# Patient Record
Sex: Female | Born: 1960 | Race: White | Hispanic: No | Marital: Married | Smoking: Never smoker
Health system: Southern US, Community
[De-identification: ages and names within clinical notes are randomized; demographics above are authoritative.]

## PROBLEM LIST (undated history)

## (undated) DIAGNOSIS — F419 Anxiety disorder, unspecified: Secondary | ICD-10-CM

## (undated) DIAGNOSIS — I10 Essential (primary) hypertension: Secondary | ICD-10-CM

## (undated) HISTORY — PX: ABDOMINAL HYSTERECTOMY: SHX81

## (undated) HISTORY — DX: Anxiety disorder, unspecified: F41.9

## (undated) HISTORY — DX: Essential (primary) hypertension: I10

---

## 2014-09-14 ENCOUNTER — Ambulatory Visit (INDEPENDENT_AMBULATORY_CARE_PROVIDER_SITE_OTHER): Payer: BLUE CROSS/BLUE SHIELD

## 2014-09-14 ENCOUNTER — Ambulatory Visit (INDEPENDENT_AMBULATORY_CARE_PROVIDER_SITE_OTHER): Payer: BLUE CROSS/BLUE SHIELD | Admitting: Physician Assistant

## 2014-09-14 VITALS — BP 132/80 | HR 73 | Temp 98.3°F | Resp 18 | Ht 66.0 in | Wt 210.0 lb

## 2014-09-14 DIAGNOSIS — R0989 Other specified symptoms and signs involving the circulatory and respiratory systems: Secondary | ICD-10-CM

## 2014-09-14 DIAGNOSIS — R059 Cough, unspecified: Secondary | ICD-10-CM

## 2014-09-14 DIAGNOSIS — R05 Cough: Secondary | ICD-10-CM

## 2014-09-14 MED ORDER — AZITHROMYCIN 250 MG PO TABS: 500.0000 mg | ORAL_TABLET | Freq: Every day | ORAL | Status: AC

## 2014-09-14 MED ORDER — HYDROCODONE-HOMATROPINE 5-1.5 MG/5ML PO SYRP: 5.0000 mL | ORAL_SOLUTION | Freq: Three times a day (TID) | ORAL | Status: AC | PRN

## 2014-09-14 NOTE — Patient Instructions (Signed)
Your chest xray showed a likely pneumonia. I will call you with the official results when the radiologist does the over read. Please take the azithromycin, 500 mg per day for 3 days. Please follow up with your pcp when you return home. Please come back here or go to an ED if you start having worse cough, fever, chills.

## 2014-09-14 NOTE — Progress Notes (Signed)
Subjective:    Patient ID: Janet Guzman, female    DOB: 11-Mar-1961, 54 y.o.   MRN: [id number]  Chief Complaint  Patient presents with  . Cough    non productive, with tightness in chest , started monday   . Sore Throat  . Chills  . Night Sweats   Prior to Admission medications   Medication Sig Start Date End Date Taking? Authorizing Provider  bisoprolol (ZEBETA) 5 MG tablet Take 5 mg by mouth daily.   Yes Historical Provider, MD  levothyroxine (SYNTHROID, LEVOTHROID) 75 MCG tablet Take 75 mcg by mouth daily before breakfast.   Yes Historical Provider, MD  sertraline (ZOLOFT) 100 MG tablet Take 100 mg by mouth daily.   Yes Historical Provider, MD  simvastatin (ZOCOR) 80 MG tablet Take 80 mg by mouth daily.   Yes Historical Provider, MD   Medications, allergies, past medical history, surgical history, family history, social history and problem list reviewed and updated.  HPI  [age] yof visiting the area from [place] presents with cough, chills, st.   Sx started yest morning with non prod cough, head congestion. Cough has worsened over past 24 hrs. 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 Chills yest, today. Felt warm but didn't take temp. Night sweats last night. Taking delsym without much relief. Has had mild chest tightness with coughing spells. No rest cp or exertional cp. No relief with delsym.   Got flu vaccine this year.   Denies abd pain, n/v, diarrhea, otalgia.   Review of Systems See HPI.     Objective:   Physical Exam  Constitutional: She appears well-developed and well-nourished.  Non-toxic appearance. She does not have a sickly appearance. She appears ill. No distress.  BP 132/80 mmHg  Pulse 73  Temp(Src) 98.3 F (36.8 C) (Oral)  Resp 18  Ht 5\' 6"  (1.676 m)  Wt 210 lb (95.255 kg)  BMI 33.91 kg/m2  SpO2 97%   HENT:  Right Ear: Tympanic membrane normal.  Left Ear: Tympanic membrane normal.  Nose: Nose normal. Right sinus exhibits no  maxillary sinus tenderness and no frontal sinus tenderness. Left sinus exhibits no maxillary sinus tenderness and no frontal sinus tenderness.  Mouth/Throat: Uvula is midline, oropharynx is clear and moist and mucous membranes are normal. No oropharyngeal exudate, posterior oropharyngeal edema or posterior oropharyngeal erythema.  Pulmonary/Chest: Effort normal. She has no decreased breath sounds. She has wheezes in the right lower field. She has rhonchi in the right upper field, the right lower field and the left upper field. She has no rales.  Lymphadenopathy:       Head (right side): No submental, no submandibular and no tonsillar adenopathy present.       Head (left side): No submental, no submandibular and no tonsillar adenopathy present.    She has no cervical adenopathy.   [facility] reading (PRIMARY) by  Dr. Clelia CroftShaw. Findings: Bibasilar infiltrates.      Assessment & Plan:   [age] yof [place] the [place] from [place] presents with cough, chills, st.   Cough - Plan: DG Chest 2 View, azithromycin (ZITHROMAX) 250 MG tablet, HYDROcodone-homatropine (HYCODAN) 5-1.5 MG/5ML syrup Abnormal lung sounds - Plan: DG Chest 2 View, azithromycin (ZITHROMAX) 250 MG tablet --bibasilar infiltrates on cxr --azithro 500 3 days --hycodan for cough, continue motrin prn --rtc if worsening --pcp when she returns home for f/u  Donnajean Lopesodd M. Victoriya Pol, PA-C Physician Assistant-Certified [facility]  09/14/2014 7:40 PM

## 2015-09-19 IMAGING — CR DG CHEST 2V
2 series · 2 of 2 positions shown · non-contrast
Comparison: None.

CLINICAL DATA: Cough.

EXAM:
CHEST  2 VIEW

[PA]
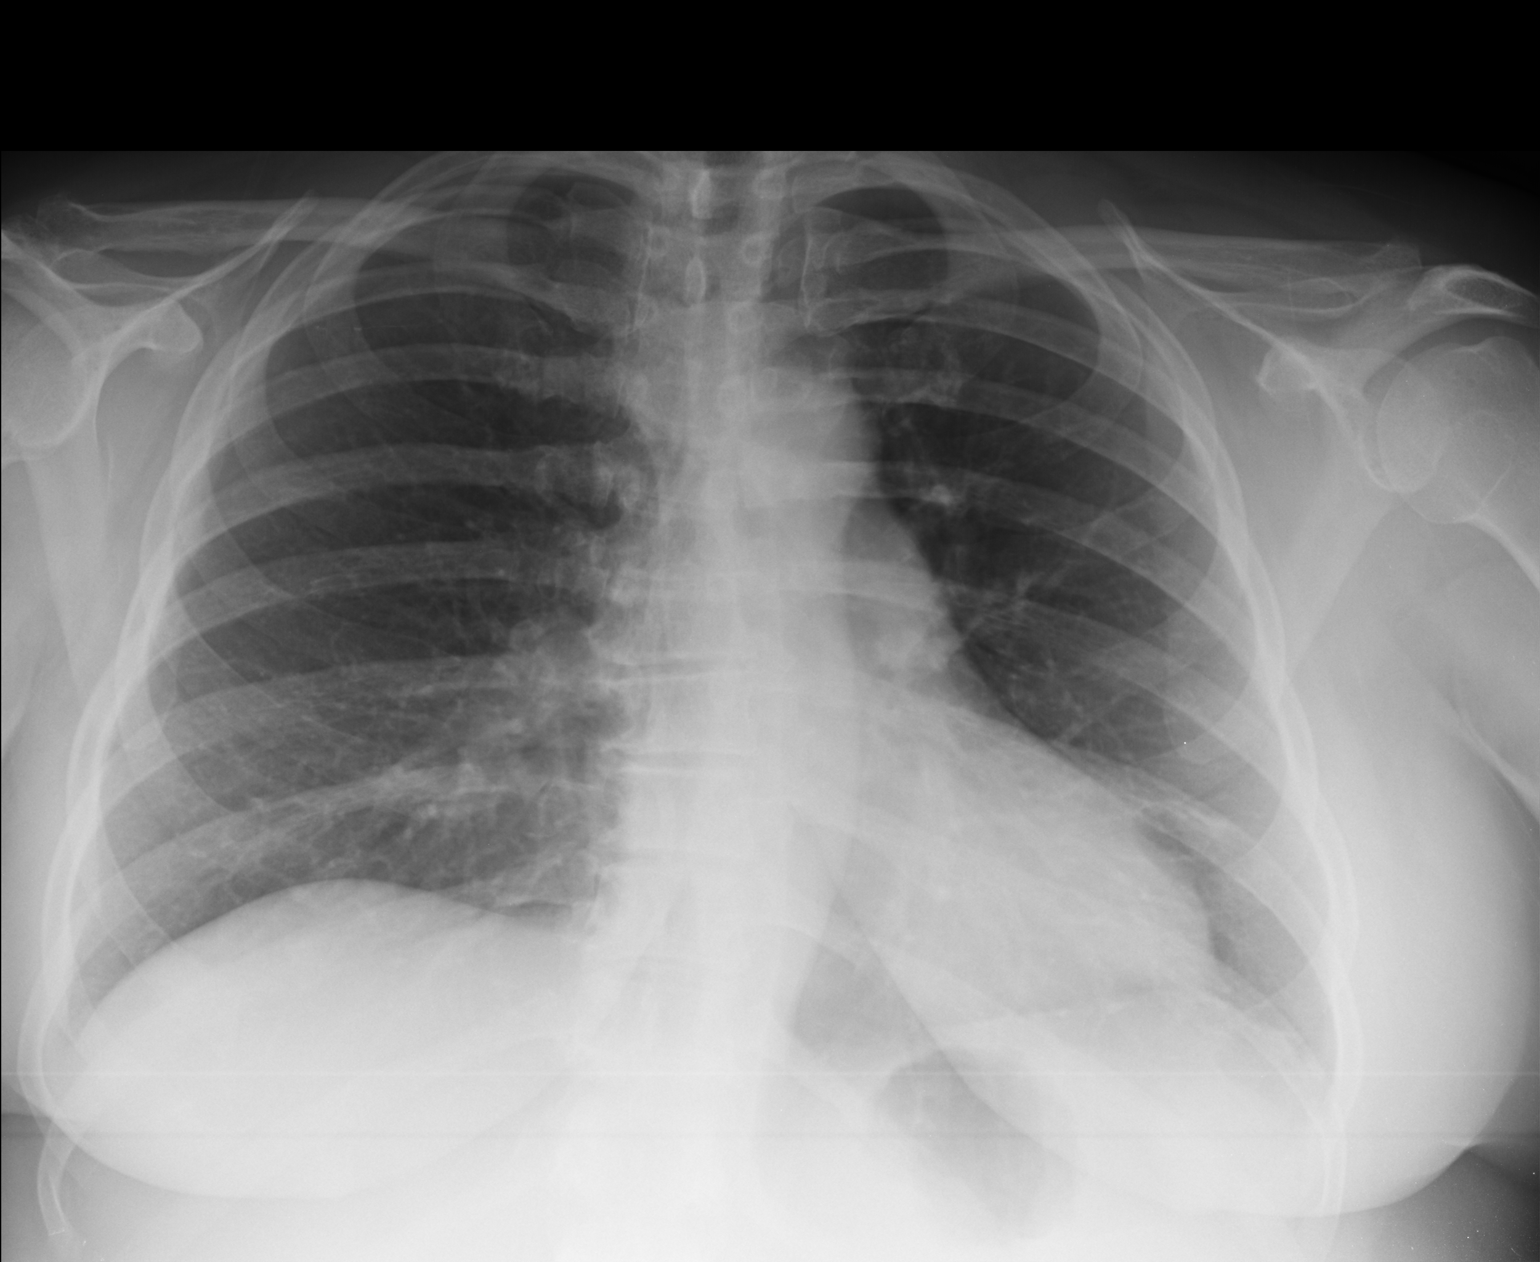

[lateral]
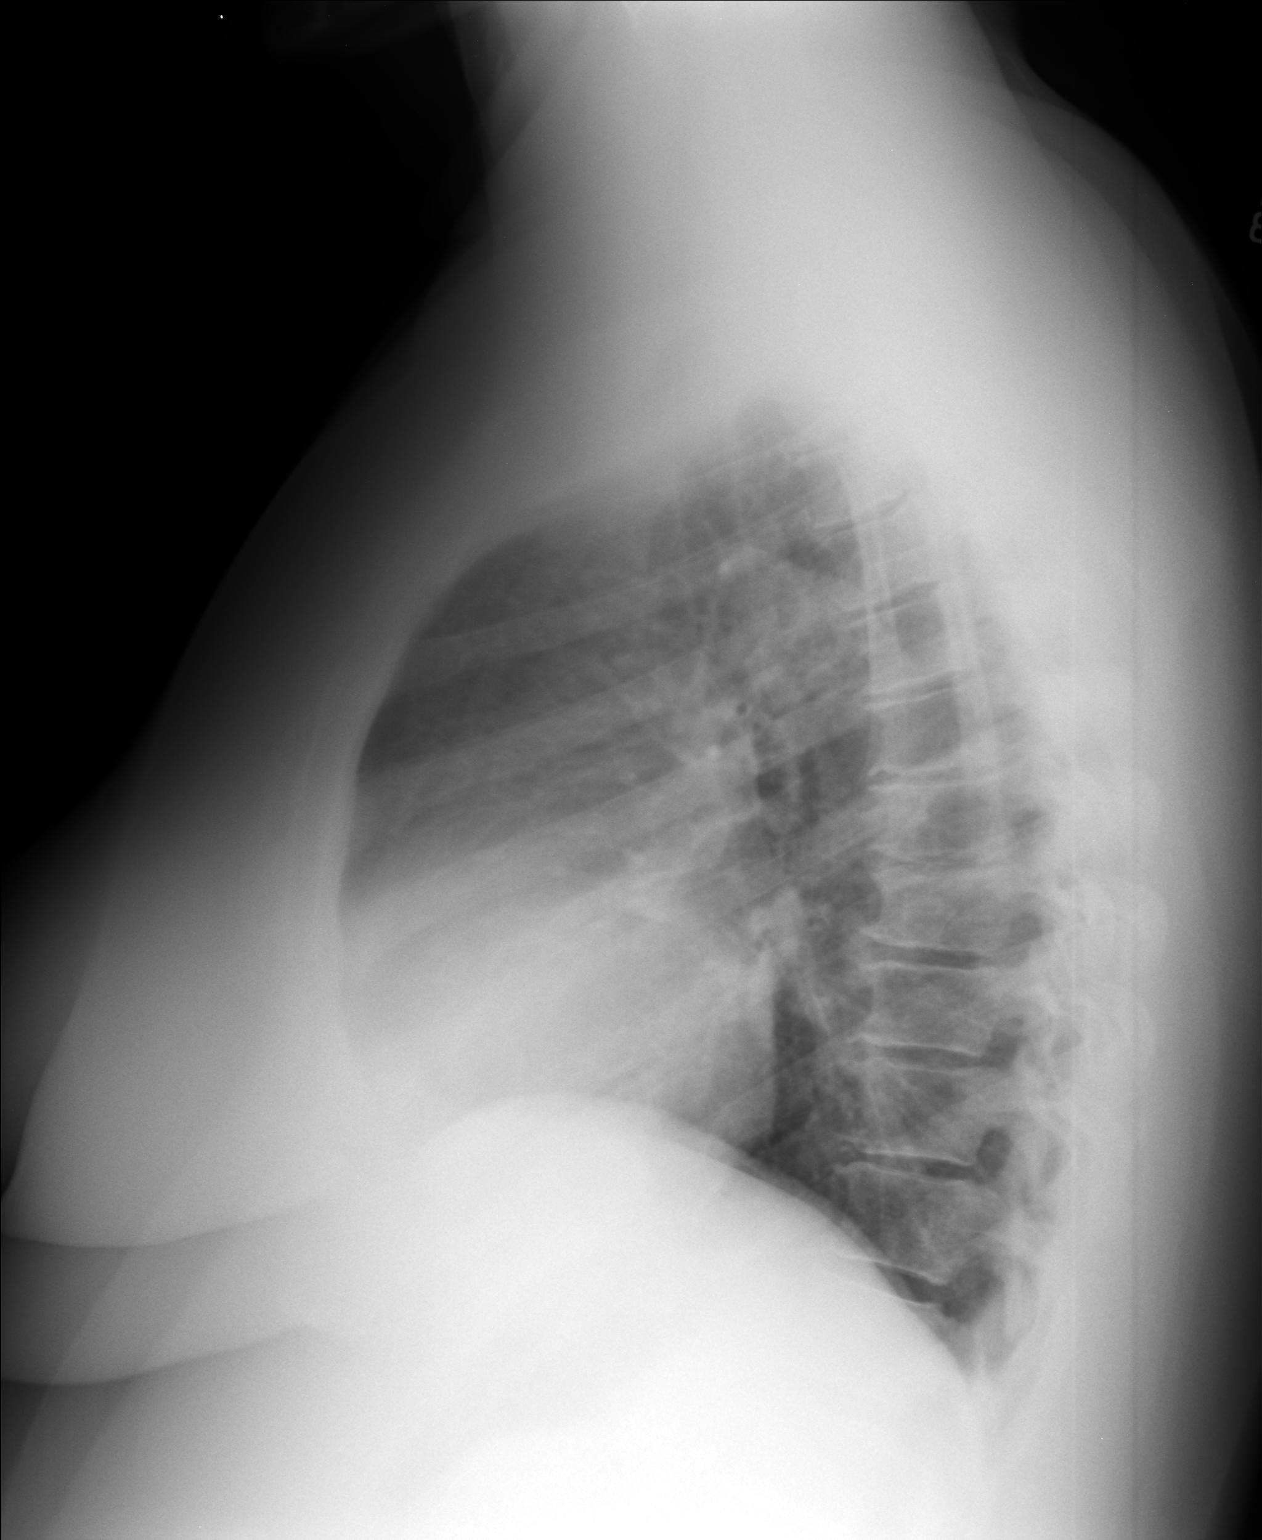

[2 of 2 positions shown; findings below may reference images not displayed]

FINDINGS: The lungs are clear. Heart size is upper normal. No pneumothorax or
pleural effusion. No focal bony abnormality.
IMPRESSION: No acute disease.
